# Patient Record
Sex: Female | Born: 1940 | Race: White | Hispanic: No | Marital: Married | Smoking: Never smoker
Health system: Southern US, Community
[De-identification: ages and names within clinical notes are randomized; demographics above are authoritative.]

## PROBLEM LIST (undated history)

## (undated) DIAGNOSIS — M797 Fibromyalgia: Secondary | ICD-10-CM

## (undated) DIAGNOSIS — M1990 Unspecified osteoarthritis, unspecified site: Secondary | ICD-10-CM

---

## 2017-02-07 ENCOUNTER — Encounter (HOSPITAL_COMMUNITY): Payer: Self-pay | Admitting: Emergency Medicine

## 2017-02-07 ENCOUNTER — Emergency Department (HOSPITAL_COMMUNITY): Payer: Medicare Other

## 2017-02-07 ENCOUNTER — Emergency Department (HOSPITAL_COMMUNITY)
Admission: EM | Admit: 2017-02-07 | Discharge: 2017-02-07 | Disposition: A | Discharge status: Home / Self Care | Payer: Medicare Other | Attending: Emergency Medicine | Admitting: Emergency Medicine

## 2017-02-07 DIAGNOSIS — S60512A Abrasion of left hand, initial encounter: Secondary | ICD-10-CM | POA: Diagnosis not present

## 2017-02-07 DIAGNOSIS — W19XXXA Unspecified fall, initial encounter: Secondary | ICD-10-CM

## 2017-02-07 DIAGNOSIS — Y92511 Restaurant or cafe as the place of occurrence of the external cause: Secondary | ICD-10-CM | POA: Diagnosis not present

## 2017-02-07 DIAGNOSIS — Y939 Activity, unspecified: Secondary | ICD-10-CM | POA: Diagnosis not present

## 2017-02-07 DIAGNOSIS — W010XXA Fall on same level from slipping, tripping and stumbling without subsequent striking against object, initial encounter: Secondary | ICD-10-CM | POA: Diagnosis not present

## 2017-02-07 DIAGNOSIS — Y999 Unspecified external cause status: Secondary | ICD-10-CM | POA: Diagnosis not present

## 2017-02-07 DIAGNOSIS — S80211A Abrasion, right knee, initial encounter: Secondary | ICD-10-CM | POA: Diagnosis not present

## 2017-02-07 DIAGNOSIS — S6991XA Unspecified injury of right wrist, hand and finger(s), initial encounter: Secondary | ICD-10-CM | POA: Diagnosis present

## 2017-02-07 DIAGNOSIS — T148XXA Other injury of unspecified body region, initial encounter: Secondary | ICD-10-CM

## 2017-02-07 DIAGNOSIS — S61411A Laceration without foreign body of right hand, initial encounter: Secondary | ICD-10-CM | POA: Diagnosis not present

## 2017-02-07 DIAGNOSIS — S91111A Laceration without foreign body of right great toe without damage to nail, initial encounter: Secondary | ICD-10-CM | POA: Insufficient documentation

## 2017-02-07 DIAGNOSIS — Z23 Encounter for immunization: Secondary | ICD-10-CM | POA: Diagnosis not present

## 2017-02-07 HISTORY — DX: Unspecified osteoarthritis, unspecified site: M19.90

## 2017-02-07 HISTORY — DX: Fibromyalgia: M79.7

## 2017-02-07 MED ORDER — TETANUS-DIPHTH-ACELL PERTUSSIS 5-2.5-18.5 LF-MCG/0.5 IM SUSP
0.5000 mL | Freq: Once | INTRAMUSCULAR | Status: AC
  Administered 2017-02-07: 0.5 mL via INTRAMUSCULAR
  Filled 2017-02-07: qty 0.5

## 2017-02-07 NOTE — ED Triage Notes (Signed)
Pt here for trip and fall last night; abrasions to hands and pain in finger; pt sts abrasion to right great toe; pt denies hitting head but does take blood thinner

## 2017-02-07 NOTE — ED Provider Notes (Signed)
[facility]-EMERGENCY DEPT Provider Note    By signing my name below, I, Earmon Phoenix, attest that this documentation has been prepared under the direction and in the presence of Melburn Hake, PA-C. Electronically Signed: Earmon Phoenix, ED Scribe. 02/07/17. 2:42 PM.   History   Chief Complaint Chief Complaint  Patient presents with  . Fall    The history is provided by the patient and medical records. No language interpreter was used.    Janice Arias is a 76 y.o. female with PMHx of arthritis and fibromyalgia who presents to the Emergency Department complaining of a mechanical fall that occurred yesterday evening. She states she tripped over something outside of [employer]. She reports associated hand pain and right great toe pain. She reports bleeding (now controlled) from abrasions to bilateral hands, right knee and a wound to the right great toe. She reports landing on her hands and elbows. She has not taken anything for pain and has not cleaned her wounds. Moving her hands or palpating the right great toe increases her pain. She denies alleviating factors. She denies head trauma, LOC, nausea, vomiting, numbness, tingling or weakness of any extremity, HA. Pt is on a daily ASA, denies any other use of anticoagulants. She states her tetanus vaccination is not UTD.   Past Medical History:  Diagnosis Date  . Arthritis   . Fibromyalgia     There are no active problems to display for this patient.   History reviewed. No pertinent surgical history.  OB History    No data available       Home Medications    Prior to Admission medications   Not on File    Family History History reviewed. No pertinent family history.  Social History Social History  Substance Use Topics  . Smoking status: Never Smoker  . Smokeless tobacco: Never Used  . Alcohol use No     Allergies   Patient has no allergy information on record.   Review of Systems Review of  Systems  Gastrointestinal: Negative for nausea and vomiting.  Musculoskeletal: Positive for arthralgias.  Skin: Positive for wound.  Neurological: Negative for weakness and numbness.     Physical Exam Updated Vital Signs BP (!) 160/61 (BP Location: Right Arm)   Pulse 63   Resp 17   Ht  (1.676 m)   Wt 137 lb (62.1 kg)   SpO2 98%   BMI 22.11 kg/m   Physical Exam  Constitutional: She is oriented to person, place, and time. She appears well-developed and well-nourished.  HENT:  Head: Normocephalic and atraumatic.  Eyes: Conjunctivae and EOM are normal. Right eye exhibits no discharge. Left eye exhibits no discharge. No scleral icterus.  Neck: Normal range of motion. Neck supple.  Cardiovascular: Normal rate and intact distal pulses.   Pulmonary/Chest: Effort normal. No respiratory distress.  Abdominal: Soft. She exhibits no distension. There is no tenderness.  Musculoskeletal: Normal range of motion. She exhibits tenderness. She exhibits no deformity.  No midline C, T, or L tenderness. Full range of motion of neck and back. Full range of motion of bilateral upper and lower extremities, with 5/5 strength. Sensation intact. 2+ radial and PT pulses. Cap refill <2 seconds. Patient able to stand and ambulate without assistance.  Mild swelling and tenderness to right fourth finger DIP joint with decreased ROM. Equal grip strength bilaterally. Tenderness to palpation to right great toe and distal forefoot with full ROM.  Neurological: She is alert and oriented to person, place,  and time.  Skin: Skin is warm and dry. Capillary refill takes less than 2 seconds.  Multi1ple small abrasions present to left palm. Larger skin tear present to right palm, no active bleeding. Small abrasion present to right anterior knee. 1 cm superficial closed laceration present to tip of right great toe. No active bleeding.  Nursing note and vitals reviewed.    ED Treatments / Results  DIAGNOSTIC  STUDIES: Oxygen Saturation is 98% on RA, normal by my interpretation.   COORDINATION OF CARE: 1:28 PM- Will X-Ray right foot and hand. Will clean wounds. Pt verbalizes understanding and agrees to plan.  Medications  Tdap (BOOSTRIX) injection 0.5 mL (0.5 mLs Intramuscular Given 02/07/17 1447)     Labs (all labs ordered are listed, but only abnormal results are displayed) Labs Reviewed - No data to display  EKG  EKG Interpretation None       Radiology Dg Hand Complete Right  Result Date: 02/07/2017 CLINICAL DATA:  Tripped and fell on breaks at a restaurant. EXAM: RIGHT HAND - COMPLETE 3+ VIEW COMPARISON:  None. FINDINGS: There is no evidence of fracture or dislocation. Generalized osteopenia. Severe osteoarthritis of the first [facility] joint. Mild osteoarthritis of the first IP joint. Severe osteoarthritis of the fourth PIP joint. Severe osteoarthritis of the third DIP joint. Mild osteoarthritis of the second DIP joint. Mild osteoarthritis of the fifth PIP joint. Soft tissues are unremarkable. IMPRESSION: No acute osseous injury of the right hand. Electronically Signed   By: Elige Ko   On: 02/07/2017 14:34   Dg Foot Complete Right  Result Date: 02/07/2017 CLINICAL DATA:  Right great toe pain and swelling after tripping and falling on bricks at a restaurant last night. EXAM: RIGHT FOOT COMPLETE - 3+ VIEW COMPARISON:  None. FINDINGS: Mild first MTP joint degenerative spur formation. Screw fixation and bone fusion involving the second PIP joint. No acute fracture or dislocation seen. Small to moderate-sized inferior calcaneal spur. Extensive dorsal tarsal spur formation and hyperostosis. IMPRESSION: 1. No acute fracture. 2. Mild first MTP joint degenerative changes. 3. Extensive dorsal tarsal degenerative changes and hyperostosis. Electronically Signed   By: Beckie Salts M.D.   On: 02/07/2017 14:35    Procedures Procedures (including critical care time)  Medications Ordered in  ED Medications  Tdap (BOOSTRIX) injection 0.5 mL (0.5 mLs Intramuscular Given 02/07/17 1447)     Initial Impression / Assessment and Plan / ED Course  I have reviewed the triage vital signs and the nursing notes.  Pertinent labs & imaging results that were available during my care of the patient were reviewed by me and considered in my medical decision making (see chart for details).     Patient presents with right hand and right great toe pain after she had a mechanical fall yesterday. Denies head injury or LOC. Reports taking aspirin daily but denies any other use of anticoagulants. Bleeding controlled yesterday evening. VSS. Exam showed mild swelling and tenderness to right fourth finger DIP joint with decreased ROM. Multiple abrasions present to bilateral hands, right knee and right great toe, no active bleeding. Extremities otherwise are vascularly intact. No evidence of head injury. X-rays of right hand and foot negative. Discussed patient with Dr. Lynelle Doctor who also evaluated patient in the ED. Wounds were irrigated and dressing was applied in the ED. Tetanus updated. Discussed results and plan for discharge with patient. Plan to discharge patient home with wound care and since chronic treatment. Advised to follow up with PCP early next week  for wound recheck. Discussed return precautions.  Final Clinical Impressions(s) / ED Diagnoses   Final diagnoses:  Fall, initial encounter  Abrasion    New Prescriptions New Prescriptions   No medications on file    I personally performed the services described in this documentation, which was scribed in my presence. The recorded information has been reviewed and is accurate.    Satira Sark [place], [place] 02/07/17 1454    Linwood Dibbles, MD 02/07/17 2119

## 2017-02-07 NOTE — ED Notes (Signed)
Patient transported to X-ray 

## 2017-02-07 NOTE — Discharge Instructions (Signed)
Keep wound clean using antibacterial soap and water, pat dry. You may apply a small amount of antibiotic ointment to wounds daily. Follow-up with your primary care provider in the next 4-5 days for wound recheck. Return to the emergency department if symptoms worsen or new onset of fever, redness, swelling, drainage, numbness, weakness.

## 2017-02-07 NOTE — ED Notes (Signed)
Pt tripped on a brick at a restaurant last pm. Pt is concerned because she takes ASA. Pt has abrasions to left hand, avulsed skin on right hand, left ring finger knuckle with slight swelling and discoloration, right great toe with abrasion. Tetanus status unknown.

## 2018-02-15 IMAGING — DX DG FOOT COMPLETE 3+V*R*
3 series · 3 of 3 positions shown · non-contrast
Comparison: None.

CLINICAL DATA: Right great toe pain and swelling after tripping and
falling on bricks at a restaurant last night.

EXAM:
RIGHT FOOT COMPLETE - 3+ VIEW

[foot ap]
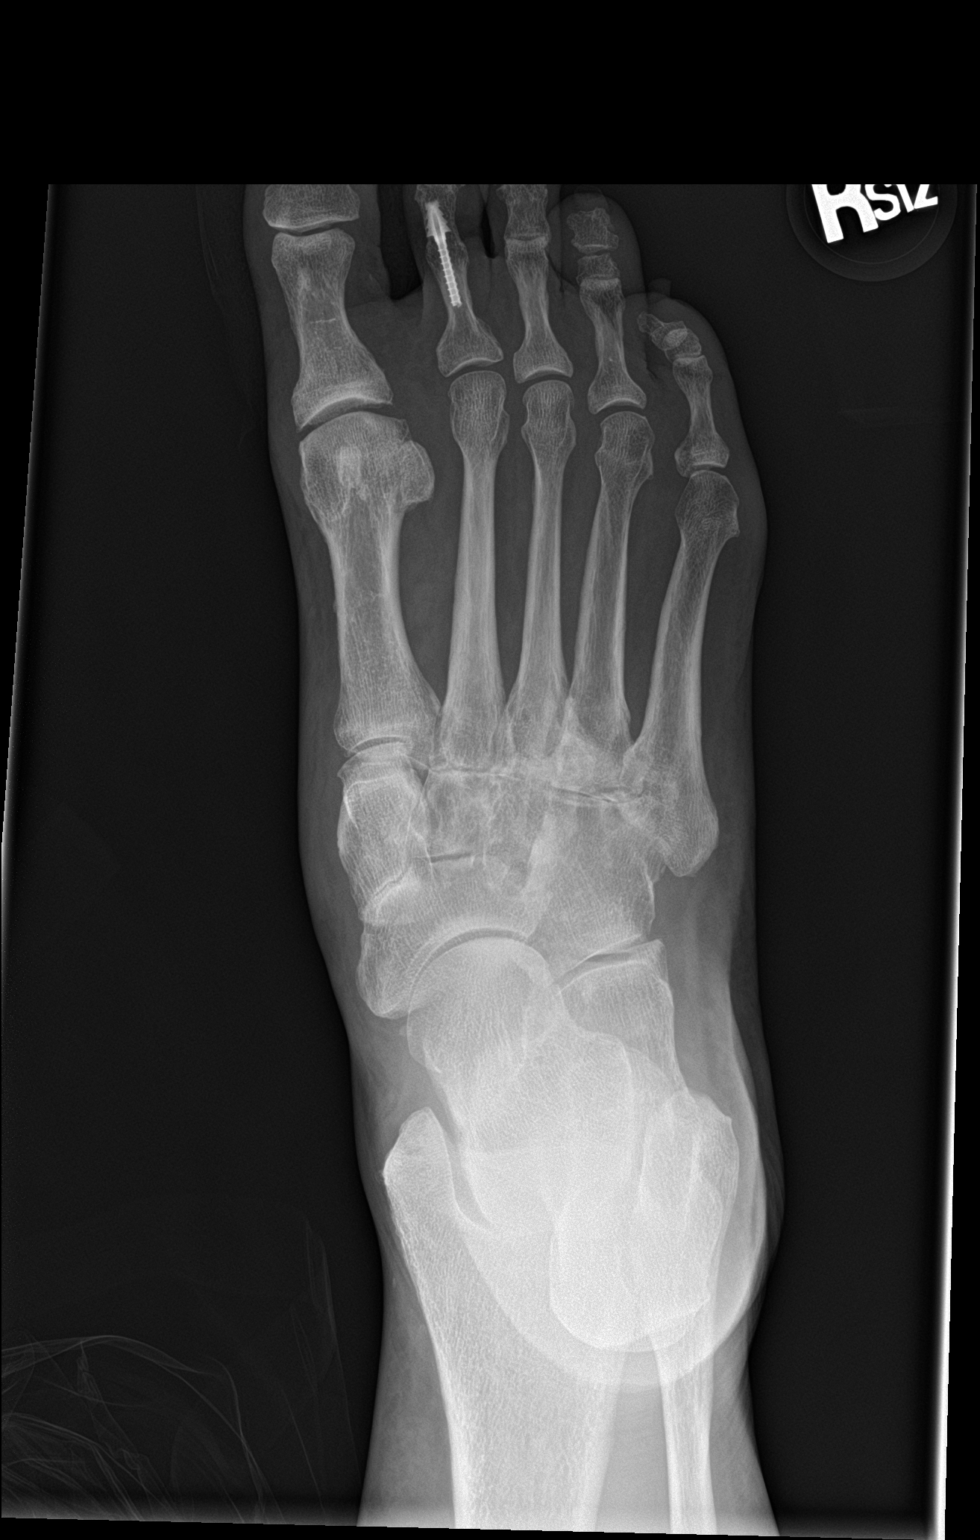

[foot obl]
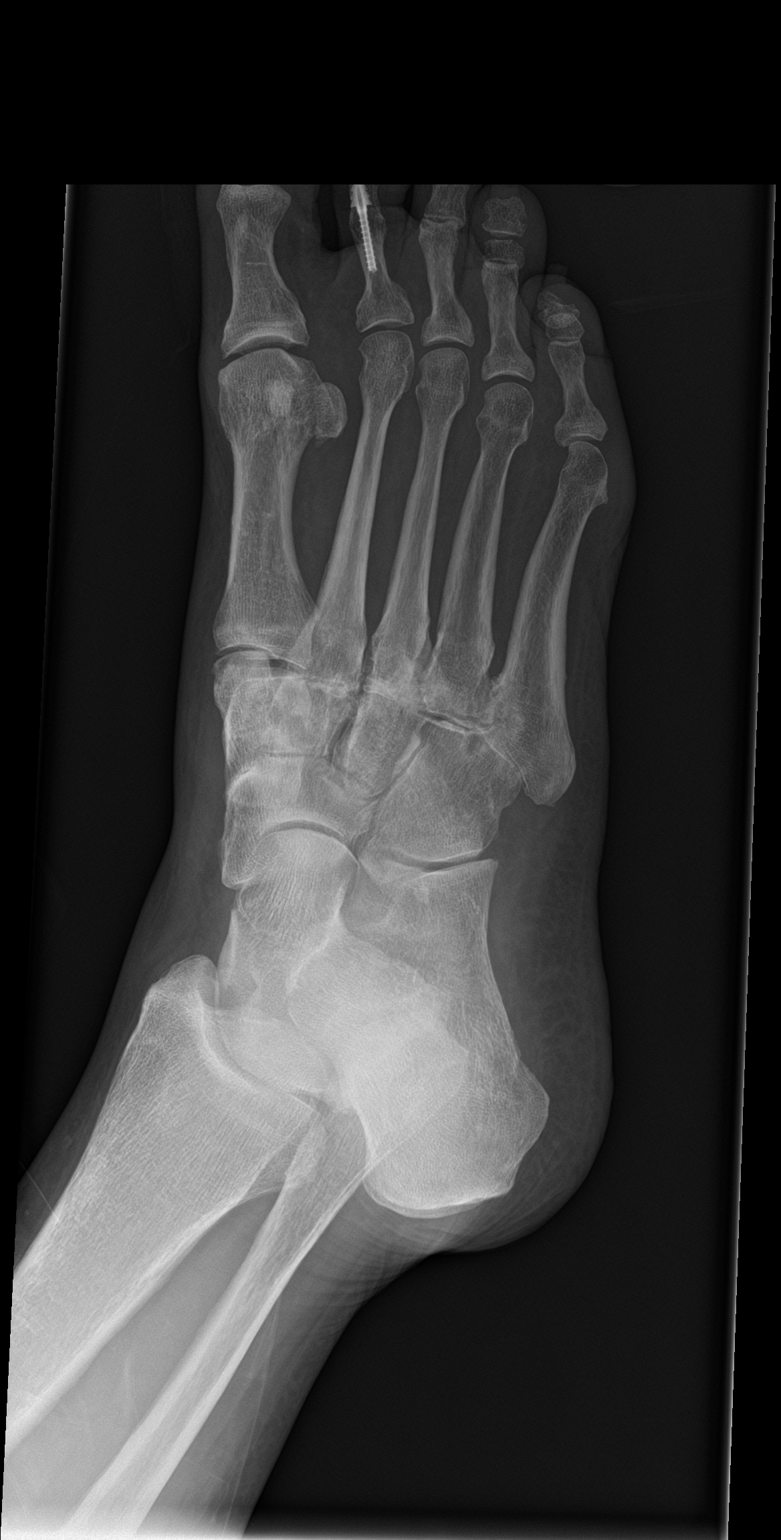

[foot lat]
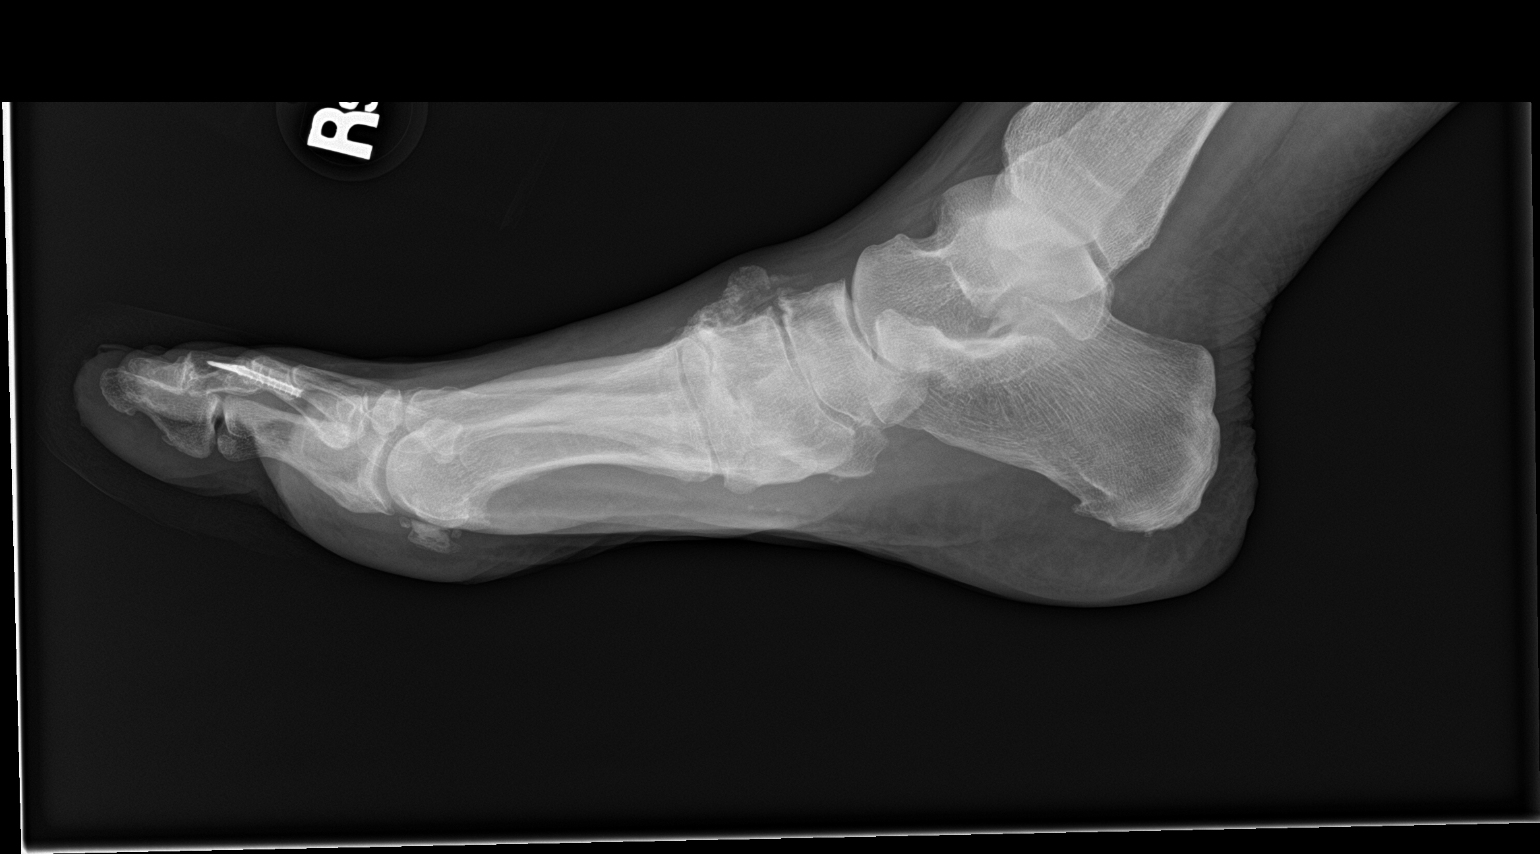

[3 of 3 positions shown; findings below may reference images not displayed]

FINDINGS: Mild first MTP joint degenerative spur formation. Screw fixation and
bone fusion involving the second PIP joint. No acute fracture or
dislocation seen. Small to moderate-sized inferior calcaneal spur.
Extensive dorsal tarsal spur formation and hyperostosis.
IMPRESSION: 1. No acute fracture.
2. Mild first MTP joint degenerative changes.
3. Extensive dorsal tarsal degenerative changes and hyperostosis.
